# Patient Record
Sex: Male | Born: 1960 | Race: White | Hispanic: No | Marital: Married | Smoking: Never smoker
Health system: Southern US, Community
[De-identification: ages and names within clinical notes are randomized; demographics above are authoritative.]

## PROBLEM LIST (undated history)

## (undated) DIAGNOSIS — G4730 Sleep apnea, unspecified: Secondary | ICD-10-CM

## (undated) DIAGNOSIS — S46009A Unspecified injury of muscle(s) and tendon(s) of the rotator cuff of unspecified shoulder, initial encounter: Secondary | ICD-10-CM

---

## 2011-08-26 HISTORY — PX: COLONOSCOPY: SHX174

## 2012-07-30 ENCOUNTER — Ambulatory Visit: Payer: Self-pay | Admitting: Gastroenterology

## 2014-12-14 ENCOUNTER — Ambulatory Visit
Admit: 2014-12-14 | Disposition: A | Discharge status: Home / Self Care | Payer: Self-pay | Attending: Family Medicine | Admitting: Family Medicine

## 2015-11-12 IMAGING — CR DG HAND COMPLETE 3+V*L*
3 series · 3 of 3 positions shown · non-contrast
Comparison: None.

CLINICAL DATA: Crush injury of the hand while at work with
cutaneous wounds between the fourth and fifth fingers

EXAM:
LEFT HAND - COMPLETE 3+ VIEW

[hand ap]
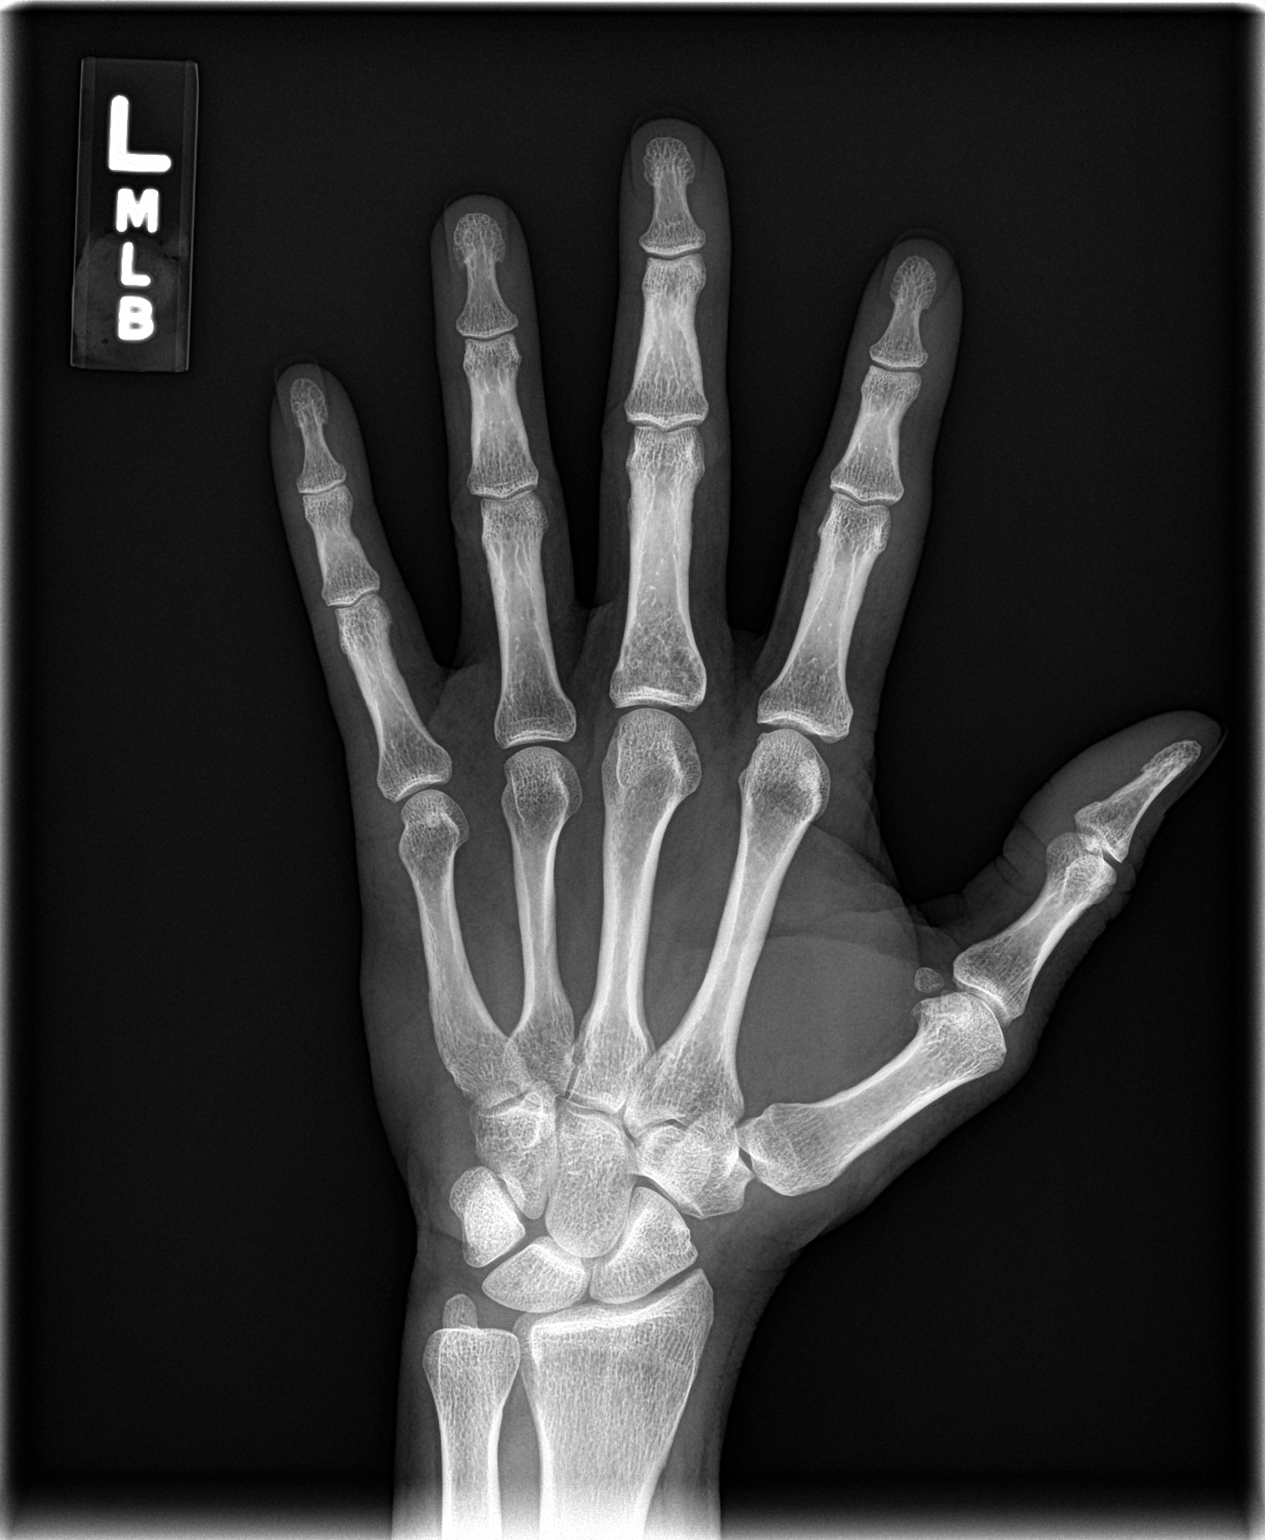

[hand obl]
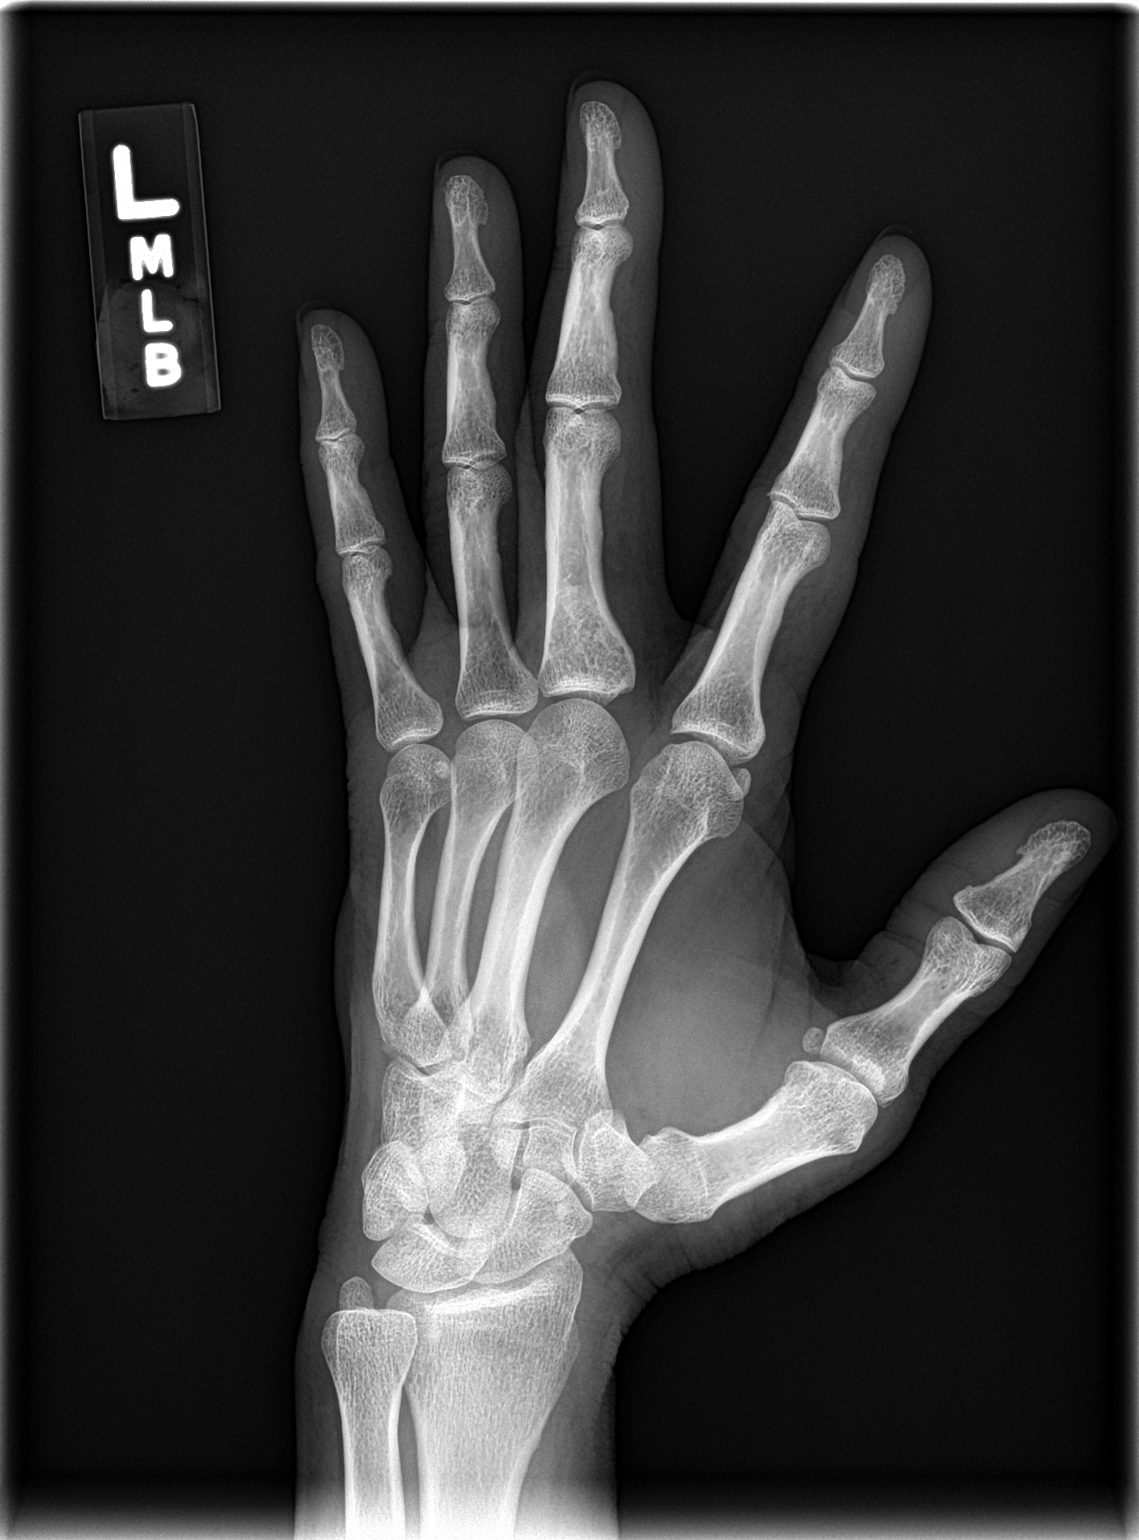

[hand lat]
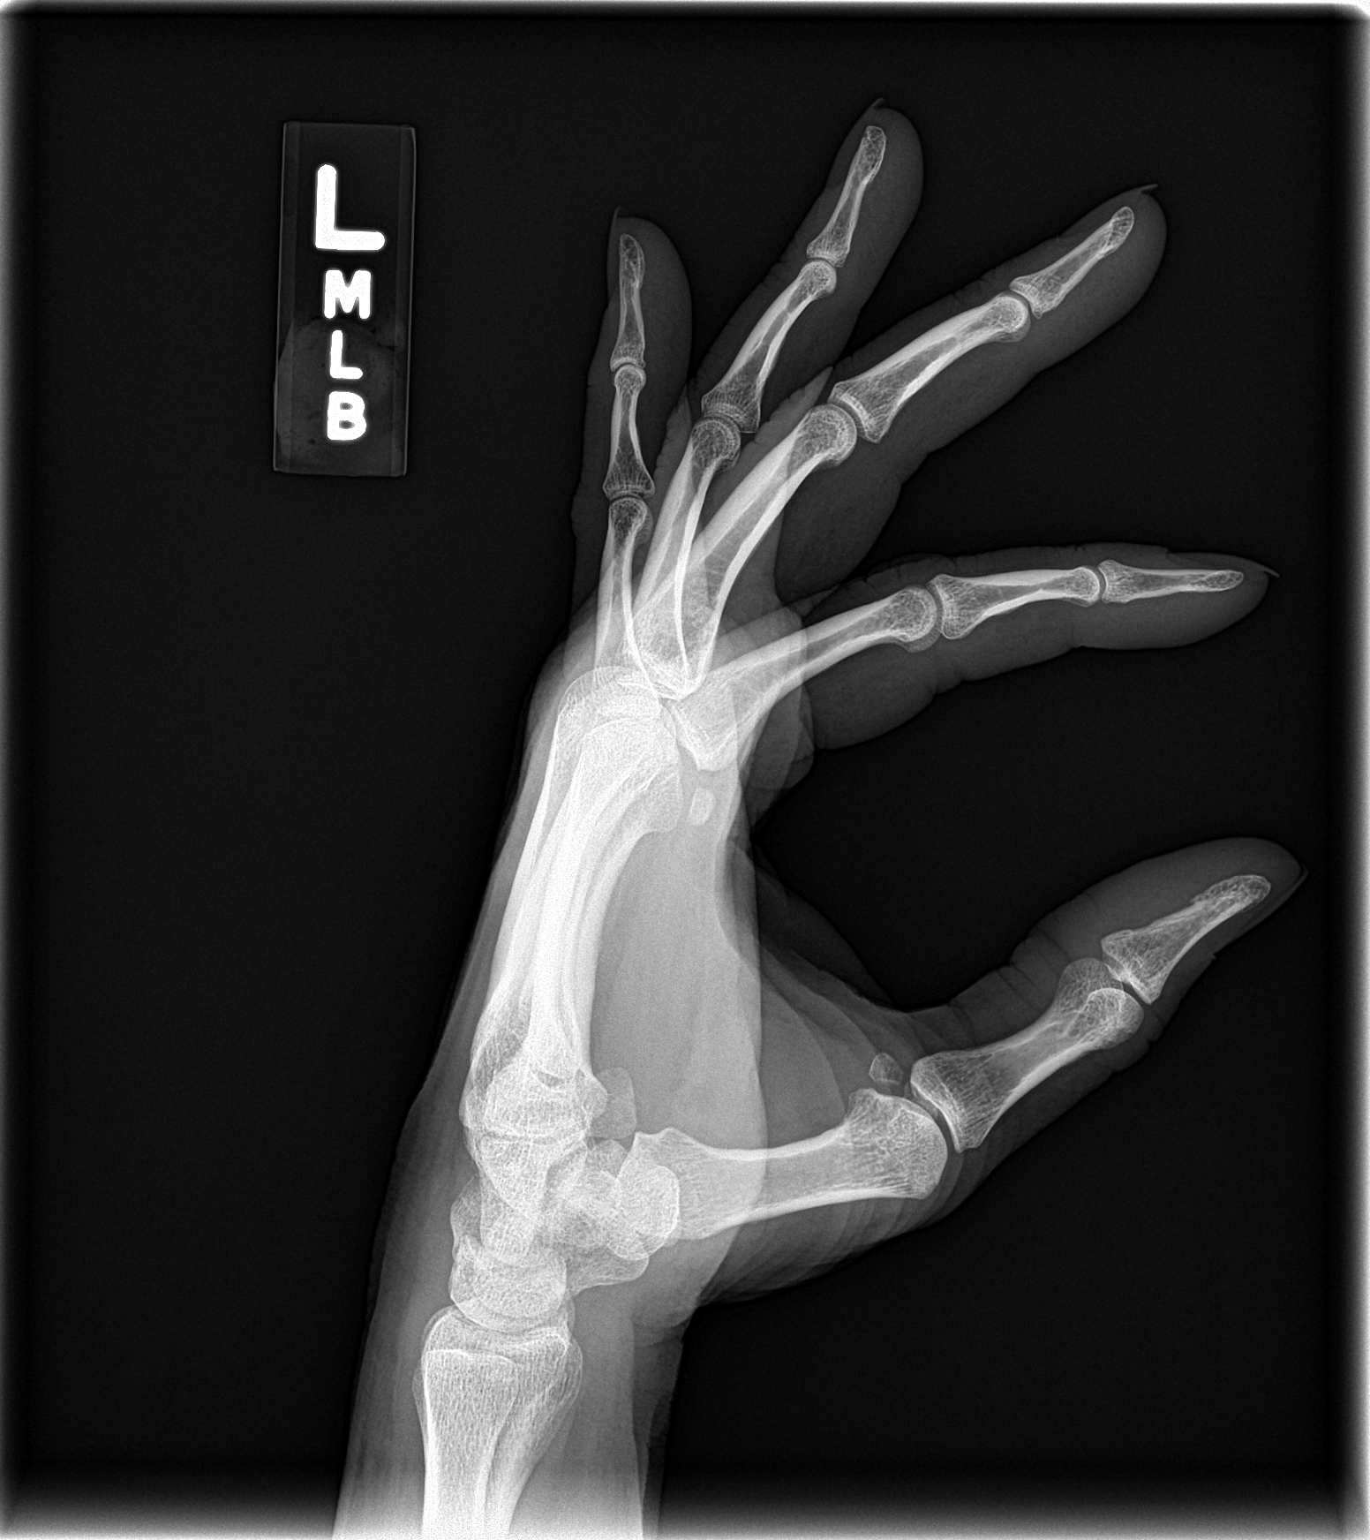

[3 of 3 positions shown; findings below may reference images not displayed]

FINDINGS: The bones of the left hand are adequately mineralized. The joint
spaces are preserved. There is no acute fracture nor significant
degenerative change. The soft tissues exhibit no foreign bodies.
IMPRESSION: There is no acute bony abnormality of the left hand.

## 2019-05-25 ENCOUNTER — Other Ambulatory Visit: Payer: Self-pay

## 2019-05-25 ENCOUNTER — Telehealth: Payer: Self-pay

## 2019-05-25 DIAGNOSIS — Z1211 Encounter for screening for malignant neoplasm of colon: Secondary | ICD-10-CM

## 2019-05-25 DIAGNOSIS — Z8371 Family history of colonic polyps: Secondary | ICD-10-CM

## 2019-05-25 NOTE — Telephone Encounter (Signed)
Gastroenterology Pre-Procedure Review  Request Date: 07/05/19 Tues Requesting Physician: Dr. Allen Norris  PATIENT REVIEW QUESTIONS: The patient responded to the following health history questions as indicated:    1. Are you having any GI issues? no 2. Do you have a personal history of Polyps? no 3. Do you have a family history of Colon Cancer or Polyps? yes (Mother and Father Colon Polyps) 4. Diabetes Mellitus? yes (oral meds metformin) 5. Joint replacements in the past 12 months?no 6. Major health problems in the past 3 months?no 7. Any artificial heart valves, MVP, or defibrillator?no    MEDICATIONS & ALLERGIES:    Patient reports the following regarding taking any anticoagulation/antiplatelet therapy:   Plavix, Coumadin, Eliquis, Xarelto, Lovenox, Pradaxa, Brilinta, or Effient? no Aspirin? yes (81 mg daily)  Patient confirms/reports the following medications:  No current outpatient medications on file.   No current facility-administered medications for this visit.     Patient confirms/reports the following allergies:  Not on File  No orders of the defined types were placed in this encounter.   AUTHORIZATION INFORMATION Primary Insurance: 1D#: Group #:  Secondary Insurance: 1D#: Group #:  SCHEDULE INFORMATION: Date: 07/05/19 Time: Location:[facility]

## 2019-07-01 ENCOUNTER — Other Ambulatory Visit
Admission: RE | Admit: 2019-07-01 | Discharge: 2019-07-01 | Disposition: A | Discharge status: Home / Self Care | Payer: 59 | Source: Ambulatory Visit | Attending: Gastroenterology | Admitting: Gastroenterology

## 2019-07-01 ENCOUNTER — Other Ambulatory Visit: Payer: Self-pay

## 2019-07-01 DIAGNOSIS — Z01812 Encounter for preprocedural laboratory examination: Secondary | ICD-10-CM | POA: Diagnosis present

## 2019-07-01 DIAGNOSIS — Z20828 Contact with and (suspected) exposure to other viral communicable diseases: Secondary | ICD-10-CM | POA: Insufficient documentation

## 2019-07-02 LAB — SARS CORONAVIRUS 2 (TAT 6-24 HRS): SARS Coronavirus 2: NEGATIVE

## 2019-07-04 ENCOUNTER — Telehealth: Payer: Self-pay | Admitting: Gastroenterology

## 2019-07-04 NOTE — Telephone Encounter (Signed)
Patient has been advised that he may not have almond milk, or protein drinks today as he should be following a clear liquid diet the entire day, starting bowel prep at 5pm, and follow instructions as advised on his instruction page regarding diabetic medications, and blood pressure medications.  Thanks [employer]

## 2019-07-04 NOTE — Telephone Encounter (Signed)
Pt  Left vm he has a procedure tomorrow and would like to know if he can drink protein drinks and almond milk please call pt

## 2019-07-05 ENCOUNTER — Ambulatory Visit: Payer: 59 | Admitting: Registered Nurse

## 2019-07-05 ENCOUNTER — Ambulatory Visit
Admission: RE | Admit: 2019-07-05 | Discharge: 2019-07-05 | Disposition: A | Discharge status: Home / Self Care | Payer: 59 | Attending: Gastroenterology | Admitting: Gastroenterology

## 2019-07-05 ENCOUNTER — Encounter
Admission: RE | Disposition: A | Discharge status: Home / Self Care | Payer: Self-pay | Source: Home / Self Care | Attending: Gastroenterology

## 2019-07-05 ENCOUNTER — Other Ambulatory Visit: Payer: Self-pay

## 2019-07-05 DIAGNOSIS — Z7982 Long term (current) use of aspirin: Secondary | ICD-10-CM | POA: Insufficient documentation

## 2019-07-05 DIAGNOSIS — K621 Rectal polyp: Secondary | ICD-10-CM | POA: Diagnosis not present

## 2019-07-05 DIAGNOSIS — G4730 Sleep apnea, unspecified: Secondary | ICD-10-CM | POA: Insufficient documentation

## 2019-07-05 DIAGNOSIS — Z8371 Family history of colonic polyps: Secondary | ICD-10-CM | POA: Insufficient documentation

## 2019-07-05 DIAGNOSIS — Z1211 Encounter for screening for malignant neoplasm of colon: Secondary | ICD-10-CM

## 2019-07-05 DIAGNOSIS — Z79899 Other long term (current) drug therapy: Secondary | ICD-10-CM | POA: Diagnosis not present

## 2019-07-05 DIAGNOSIS — K640 First degree hemorrhoids: Secondary | ICD-10-CM | POA: Insufficient documentation

## 2019-07-05 DIAGNOSIS — Z7984 Long term (current) use of oral hypoglycemic drugs: Secondary | ICD-10-CM | POA: Insufficient documentation

## 2019-07-05 HISTORY — DX: Unspecified injury of muscle(s) and tendon(s) of the rotator cuff of unspecified shoulder, initial encounter: S46.009A

## 2019-07-05 HISTORY — DX: Sleep apnea, unspecified: G47.30

## 2019-07-05 HISTORY — PX: COLONOSCOPY WITH PROPOFOL: SHX5780

## 2019-07-05 LAB — GLUCOSE, CAPILLARY: Glucose-Capillary: 169 mg/dL — ABNORMAL HIGH (ref 70–99)

## 2019-07-05 SURGERY — COLONOSCOPY WITH PROPOFOL
Anesthesia: General

## 2019-07-05 MED ORDER — PROPOFOL 500 MG/50ML IV EMUL
INTRAVENOUS | Status: DC | PRN
  Administered 2019-07-05: 140 ug/kg/min via INTRAVENOUS

## 2019-07-05 MED ORDER — PROPOFOL 10 MG/ML IV BOLUS
INTRAVENOUS | Status: DC | PRN
  Administered 2019-07-05: 30 mg via INTRAVENOUS
  Administered 2019-07-05: 70 mg via INTRAVENOUS

## 2019-07-05 MED ORDER — LIDOCAINE HCL (PF) 2 % IJ SOLN
INTRAMUSCULAR | Status: AC
  Filled 2019-07-05: qty 10

## 2019-07-05 MED ORDER — PROPOFOL 500 MG/50ML IV EMUL
INTRAVENOUS | Status: AC
  Filled 2019-07-05: qty 250

## 2019-07-05 MED ORDER — SODIUM CHLORIDE 0.9 % IV SOLN
INTRAVENOUS | Status: DC
  Administered 2019-07-05: 08:00:00 via INTRAVENOUS

## 2019-07-05 MED ORDER — GLYCOPYRROLATE 0.2 MG/ML IJ SOLN
INTRAMUSCULAR | Status: AC
  Filled 2019-07-05: qty 1

## 2019-07-05 NOTE — Anesthesia Post-op Follow-up Note (Signed)
Anesthesia QCDR form completed.        

## 2019-07-05 NOTE — Anesthesia Postprocedure Evaluation (Signed)
Anesthesia Post Note  Patient: James Everett  Procedure(s) Performed: COLONOSCOPY WITH PROPOFOL (N/A )  Patient location during evaluation: PACU Anesthesia Type: General Level of consciousness: awake and alert Pain management: pain level controlled Vital Signs Assessment: post-procedure vital signs reviewed and stable Respiratory status: spontaneous breathing, nonlabored ventilation and respiratory function stable Cardiovascular status: blood pressure returned to baseline and stable Postop Assessment: no apparent nausea or vomiting Anesthetic complications: no     Last Vitals:  Vitals:   07/05/19 0852 07/05/19 0902  BP: 96/72 125/67  Pulse:    Resp:    Temp:    SpO2:      Last Pain:  Vitals:   07/05/19 0902  TempSrc:   PainSc: 0-No pain                 Durenda Hurt

## 2019-07-05 NOTE — Anesthesia Preprocedure Evaluation (Addendum)
Anesthesia Evaluation  Patient identified by MRN, date of birth, ID band Patient awake    Reviewed: Allergy & Precautions, H&P , NPO status , Patient's Chart, lab work & pertinent test results  Airway Mallampati: I  TM Distance: >3 FB Neck ROM: full    Dental  (+) Chipped   Pulmonary sleep apnea and Continuous Positive Airway Pressure Ventilation ,           Cardiovascular negative cardio ROS       Neuro/Psych negative neurological ROS  negative psych ROS   GI/Hepatic negative GI ROS, Neg liver ROS,   Endo/Other  negative endocrine ROS  Renal/GU negative Renal ROS  negative genitourinary   Musculoskeletal   Abdominal   Peds  Hematology negative hematology ROS (+)   Anesthesia Other Findings Past Medical History: No date: Rotator cuff injury     Comment:  Right side No date: Sleep apnea  Past Surgical History: 2013: COLONOSCOPY     Reproductive/Obstetrics negative OB ROS                            Anesthesia Physical Anesthesia Plan  ASA: II  Anesthesia Plan: General   Post-op Pain Management:    Induction:   PONV Risk Score and Plan: Propofol infusion and TIVA  Airway Management Planned: Natural Airway and Nasal Cannula  Additional Equipment:   Intra-op Plan:   Post-operative Plan:   Informed Consent: I have reviewed the patients History and Physical, chart, labs and discussed the procedure including the risks, benefits and alternatives for the proposed anesthesia with the patient or authorized representative who has indicated his/her understanding and acceptance.     Dental Advisory Given  Plan Discussed with: Anesthesiologist, CRNA and Surgeon  Anesthesia Plan Comments:         Anesthesia Quick Evaluation

## 2019-07-05 NOTE — Op Note (Signed)
[facility] Gastroenterology Patient Name: James Everett Procedure Date: 07/05/2019 8:06 AM MRN: [id number] Account #: [id number] Date of Birth: 1960/10/24 Admit Type: Outpatient Age: 58 Room: [facility] ENDO ROOM 4 Gender: Male Note Status: Finalized Procedure:             Colonoscopy Indications:           Family history of advanced adenomas of the colon in                         multiple first-degree relatives Providers:             Lucilla Lame MD, MD Medicines:             Propofol per Anesthesia Complications:         No immediate complications. Procedure:             Pre-Anesthesia Assessment:                        - Prior to the procedure, a History and Physical was                         performed, and patient medications and allergies were                         reviewed. The patient's tolerance of previous                         anesthesia was also reviewed. The risks and benefits                         of the procedure and the sedation options and risks                         were discussed with the patient. All questions were                         answered, and informed consent was obtained. Prior                         Anticoagulants: The patient has taken no previous                         anticoagulant or antiplatelet agents. ASA Grade                         Assessment: II - A patient with mild systemic disease.                         After reviewing the risks and benefits, the patient                         was deemed in satisfactory condition to undergo the                         procedure.                        After obtaining informed consent, the colonoscope was  passed under direct vision. Throughout the procedure,                         the patient's blood pressure, pulse, and oxygen                         saturations were monitored continuously. The                         Colonoscope was introduced through  the anus and                         advanced to the the cecum, identified by appendiceal                         orifice and ileocecal valve. The colonoscopy was                         performed without difficulty. The patient tolerated                         the procedure well. The quality of the bowel                         preparation was excellent. Findings:      The perianal and digital rectal examinations were normal.      A 3 mm polyp was found in the rectum. The polyp was sessile. The polyp       was removed with a cold biopsy forceps. Resection and retrieval were       complete.      Non-bleeding internal hemorrhoids were found during retroflexion. The       hemorrhoids were Grade I (internal hemorrhoids that do not prolapse). Impression:            - One 3 mm polyp in the rectum, removed with a cold                         biopsy forceps. Resected and retrieved.                        - Non-bleeding internal hemorrhoids. Recommendation:        - Discharge patient to home.                        - Resume previous diet.                        - Continue present medications.                        - Await pathology results.                        - Repeat colonoscopy in 5 years for surveillance. Procedure Code(s):     --- Professional ---                        [phone], Colonoscopy, flexible; with biopsy, single or  multiple Diagnosis Code(s):     --- Professional ---                        Z83.71, Family history of colonic polyps                        K62.1, Rectal polyp CPT copyright 2019 American Medical Association. All rights reserved. The codes documented in this report are preliminary and upon coder review may  be revised to meet current compliance requirements. Midge Minium MD, MD 07/05/2019 8:32:28 AM This report has been signed electronically. Number of Addenda: 0 Note Initiated On: 07/05/2019 8:06 AM Scope Withdrawal Time: 0 hours 8  minutes 17 seconds  Total Procedure Duration: 0 hours 14 minutes 57 seconds  Estimated Blood Loss:  Estimated blood loss: none.      [facility]

## 2019-07-05 NOTE — H&P (Signed)
Midge Minium, MD [facility] [address], [address] [place], [place] [address] [phone] Fax : [phone]  Primary Care Physician:  Dortha Kern, MD Primary Gastroenterologist:  Dr. Servando Snare  Pre-Procedure History & Physical: HPI:  James Everett is a 58 y.o. male is here for an colonoscopy.   Past Medical History:  Diagnosis Date  . Rotator cuff injury    Right side  . Sleep apnea     History reviewed. No pertinent surgical history.  Prior to Admission medications   Medication Sig Start Date End Date Taking? Authorizing Provider  aspirin EC 81 MG tablet Take 81 mg by mouth daily.   Yes [provider]  co-enzyme Q-10 30 MG capsule Take 30 mg by mouth 3 (three) times daily.   Yes [provider]  lisinopril-hydrochlorothiazide (ZESTORETIC) 10-12.5 MG tablet Take 1 tablet by mouth daily.   Yes [provider]  metFORMIN (GLUCOPHAGE-XR) 750 MG 24 hr tablet Take 750 mg by mouth daily with breakfast.   Yes [provider]    Allergies as of 05/25/2019  . (Not on File)    History reviewed. No pertinent family history.  Social History   Socioeconomic History  . Marital status: Significant Other    Spouse name: Not on file  . Number of children: Not on file  . Years of education: Not on file  . Highest education level: Not on file  Occupational History  . Not on file  Social Needs  . Financial resource strain: Not on file  . Food insecurity    Worry: Not on file    Inability: Not on file  . Transportation needs    Medical: Not on file    Non-medical: Not on file  Tobacco Use  . Smoking status: Not on file  Substance and Sexual Activity  . Alcohol use: Not on file  . Drug use: Not on file  . Sexual activity: Not on file  Lifestyle  . Physical activity    Days per week: Not on file    Minutes per session: Not on file  . Stress: Not on file  Relationships  . Social Musician on phone: Not on file    Gets  together: Not on file    Attends religious service: Not on file    Active member of club or organization: Not on file    Attends meetings of clubs or organizations: Not on file    Relationship status: Not on file  . Intimate partner violence    Fear of current or ex partner: Not on file    Emotionally abused: Not on file    Physically abused: Not on file    Forced sexual activity: Not on file  Other Topics Concern  . Not on file  Social History Narrative  . Not on file    Review of Systems: See HPI, otherwise negative ROS  Physical Exam: There were no vitals taken for this visit. General:   Alert,  pleasant and cooperative in NAD Head:  Normocephalic and atraumatic. Neck:  Supple; no masses or thyromegaly. Lungs:  Clear throughout to auscultation.    Heart:  Regular rate and rhythm. Abdomen:  Soft, nontender and nondistended. Normal bowel sounds, without guarding, and without rebound.   Neurologic:  Alert and  oriented x4;  grossly normal neurologically.  Impression/Plan: James Everett is here for an colonoscopy to be performed for family history of colon polyps. Last colonoscopy 2013  Risks,  benefits, limitations, and alternatives regarding  colonoscopy have been reviewed with the patient.  Questions have been answered.  All parties agreeable.   Lucilla Lame, MD  07/05/2019, 7:56 AM

## 2019-07-05 NOTE — Transfer of Care (Signed)
Immediate Anesthesia Transfer of Care Note  Patient: James Everett  Procedure(s) Performed: COLONOSCOPY WITH PROPOFOL (N/A )  Patient Location: PACU  Anesthesia Type:General  Level of Consciousness: sedated  Airway & Oxygen Therapy: Patient Spontanous Breathing  Post-op Assessment: Report given to RN and Post -op Vital signs reviewed and stable  Post vital signs: Reviewed and stable  Last Vitals:  Vitals Value Taken Time  BP 100/53 07/05/19 0833  Temp 36.7 C 07/05/19 0832  Pulse 68 07/05/19 0834  Resp 32 07/05/19 0834  SpO2 97 % 07/05/19 0834  Vitals shown include unvalidated device data.  Last Pain:  Vitals:   07/05/19 0832  TempSrc: Temporal  PainSc: Asleep         Complications: No apparent anesthesia complications

## 2019-07-06 ENCOUNTER — Encounter: Payer: Self-pay | Admitting: Gastroenterology

## 2019-07-06 LAB — SURGICAL PATHOLOGY

## 2019-07-28 ENCOUNTER — Telehealth: Payer: Self-pay | Admitting: Gastroenterology

## 2019-07-28 NOTE — Telephone Encounter (Signed)
Spoke with pt regarding the message I received about viewing his [facility] web site and it showing most of it had been denied but he has not received a bill from [employer]. Advised pt to contact his insurance company and discuss breakdown. Advised him he can either wait to see if he receives a bill or contact the billing department after he speaks with them to discuss.

## 2019-07-28 NOTE — Telephone Encounter (Signed)
Pt left vm stating he had a procedure with Dr. Allen Norris 07/11/19 and he was checking on his [employer] website and it looks like most of it has been denied he wanted to discuss the coding

## 2022-01-25 ENCOUNTER — Ambulatory Visit
Admission: EM | Admit: 2022-01-25 | Discharge: 2022-01-25 | Disposition: A | Discharge status: Home / Self Care | Payer: 59

## 2022-01-25 ENCOUNTER — Encounter: Payer: Self-pay | Admitting: Emergency Medicine

## 2022-01-25 DIAGNOSIS — J069 Acute upper respiratory infection, unspecified: Secondary | ICD-10-CM

## 2022-01-25 MED ORDER — BENZONATATE 100 MG PO CAPS: 200.0000 mg | ORAL_CAPSULE | Freq: Three times a day (TID) | ORAL | 0 refills | Status: AC

## 2022-01-25 MED ORDER — PROMETHAZINE-DM 6.25-15 MG/5ML PO SYRP: 5.0000 mL | ORAL_SOLUTION | Freq: Four times a day (QID) | ORAL | 0 refills | Status: AC | PRN

## 2022-01-25 MED ORDER — AMOXICILLIN-POT CLAVULANATE 875-125 MG PO TABS: 1.0000 | ORAL_TABLET | Freq: Two times a day (BID) | ORAL | 0 refills | Status: AC

## 2022-01-25 MED ORDER — IPRATROPIUM BROMIDE 0.06 % NA SOLN: 2.0000 | Freq: Four times a day (QID) | NASAL | 12 refills | Status: AC

## 2022-01-25 NOTE — ED Triage Notes (Signed)
Patient c/o cough, nasal congestion, sinus congestion, and headache that started on Wed.  Patient denies fevers.

## 2022-01-25 NOTE — Discharge Instructions (Signed)
The Augmentin twice daily with food for 10 days for treatment of your URI.  Perform sinus irrigation 2-3 times a day with a [employer]Med sinus rinse kit and distilled water.  Do not use tap water.  You can use plain over-the-counter Mucinex every 6 hours to break up the stickiness of the mucus so your body can clear it.  Increase your oral fluid intake to thin out your mucus so that is also able for your body to clear more easily.  Take an over-the-counter probiotic, such as Culturelle-align-activia, 1 hour after each dose of antibiotic to prevent diarrhea.  Use the Atrovent nasal spray, 2 squirts in each nostril every 6 hours, as needed for runny nose and postnasal drip.  Use the Tessalon Perles every 8 hours during the day.  Take them with a small sip of water.  They may give you some numbness to the base of your tongue or a metallic taste in your mouth, this is normal.  Use the Promethazine DM cough syrup at bedtime for cough and congestion.  It will make you drowsy so do not take it during the day.  If you develop any new or worsening symptoms return for reevaluation or see your primary care provider.  

## 2022-01-25 NOTE — ED Provider Notes (Signed)
[facility]    CSN: [id number] Arrival date & time: 01/25/22  1458      History   Chief Complaint Chief Complaint  Patient presents with   Sinus Problem   Headache    HPI James Everett is a 61 y.o. male.   HPI  61 year old male here for evaluation of respiratory symptoms.  Patient reports that for last 3 days he has been experiencing a subjective fever, headache, nasal congestion with a brownish-yellow nasal discharge, right ear pain, sore throat, and a nonproductive cough.  He denies any shortness of breath or wheezing.  He is unaware of any sick contacts.  Past Medical History:  Diagnosis Date   Rotator cuff injury    Right side   Sleep apnea     Patient Active Problem List   Diagnosis Date Noted   Family history of colonic polyps    Rectal polyp     Past Surgical History:  Procedure Laterality Date   COLONOSCOPY  2013   COLONOSCOPY WITH PROPOFOL N/A 07/05/2019   Procedure: COLONOSCOPY WITH PROPOFOL;  Surgeon: Lucilla Lame, MD;  Location: [facility] ENDOSCOPY;  Service: Endoscopy;  Laterality: N/A;       Home Medications    Prior to Admission medications   Medication Sig Start Date End Date Taking? Authorizing Provider  amoxicillin-clavulanate (AUGMENTIN) 875-125 MG tablet Take 1 tablet by mouth every 12 (twelve) hours for 10 days. 01/25/22 02/04/22 Yes Margarette Canada, NP  aspirin EC 81 MG tablet Take 81 mg by mouth daily.   Yes [provider]  benzonatate (TESSALON) 100 MG capsule Take 2 capsules (200 mg total) by mouth every 8 (eight) hours. 01/25/22  Yes Margarette Canada, NP  co-enzyme Q-10 30 MG capsule Take 30 mg by mouth 3 (three) times daily.   Yes [provider]  ipratropium (ATROVENT) 0.06 % nasal spray Place 2 sprays into both nostrils 4 (four) times daily. 01/25/22  Yes Margarette Canada, NP  lisinopril-hydrochlorothiazide (ZESTORETIC) 10-12.5 MG tablet Take 1 tablet by mouth daily.   Yes [provider]  metFORMIN  (GLUCOPHAGE-XR) 750 MG 24 hr tablet Take 750 mg by mouth daily with breakfast.   Yes [provider]  promethazine-dextromethorphan (PROMETHAZINE-DM) 6.25-15 MG/5ML syrup Take 5 mLs by mouth 4 (four) times daily as needed. 01/25/22  Yes Margarette Canada, NP  JARDIANCE 10 MG TABS tablet Take 10 mg by mouth every morning. 12/31/21   [provider]    Family History History reviewed. No pertinent family history.  Social History Social History   Tobacco Use   Smoking status: Never   Smokeless tobacco: Never  Vaping Use   Vaping Use: Never used  Substance Use Topics   Alcohol use: Yes   Drug use: Never     Allergies   Patient has no known allergies.   Review of Systems Review of Systems  Constitutional:  Positive for fever.  HENT:  Positive for congestion, ear pain, rhinorrhea and sore throat.   Respiratory:  Positive for cough. Negative for shortness of breath and wheezing.   Gastrointestinal:  Negative for diarrhea, nausea and vomiting.  Skin:  Negative for rash.  Hematological: Negative.   Psychiatric/Behavioral: Negative.      Physical Exam Triage Vital Signs ED Triage Vitals  Enc Vitals Group     BP 01/25/22 1517 132/80     Pulse Rate 01/25/22 1517 83     Resp 01/25/22 1517 15     Temp 01/25/22 1517 98.1 F (36.7  C)     Temp Source 01/25/22 1517 Oral     SpO2 01/25/22 1517 97 %     Weight 01/25/22 1515 162 lb 14.7 oz (73.9 kg)     Height 01/25/22 1515 '6\' 2"'  (1.88 m)     Head Circumference --      Peak Flow --      Pain Score 01/25/22 1515 0     Pain Loc --      Pain Edu? --      Excl. in [place]? --    No data found.  Updated Vital Signs BP 132/80 (BP Location: Left Arm)   Pulse 83   Temp 98.1 F (36.7 C) (Oral)   Resp 15   Ht '6\' 2"'  (1.88 m)   Wt 162 lb 14.7 oz (73.9 kg)   SpO2 97%   BMI 20.92 kg/m   Visual Acuity Right Eye Distance:   Left Eye Distance:   Bilateral Distance:    Right Eye Near:   Left Eye Near:    Bilateral Near:      Physical Exam Vitals and nursing note reviewed.  Constitutional:      Appearance: Normal appearance. He is not ill-appearing.  HENT:     Head: Normocephalic and atraumatic.     Right Ear: Tympanic membrane, ear canal and external ear normal. There is no impacted cerumen.     Left Ear: Tympanic membrane, ear canal and external ear normal. There is no impacted cerumen.     Nose: Congestion and rhinorrhea present.     Mouth/Throat:     Mouth: Mucous membranes are moist.     Pharynx: Oropharynx is clear. Posterior oropharyngeal erythema present. No oropharyngeal exudate.  Cardiovascular:     Rate and Rhythm: Normal rate and regular rhythm.     Pulses: Normal pulses.     Heart sounds: Normal heart sounds. No murmur heard.   No friction rub. No gallop.  Pulmonary:     Effort: Pulmonary effort is normal.     Breath sounds: Normal breath sounds. No wheezing, rhonchi or rales.  Musculoskeletal:     Cervical back: Normal range of motion and neck supple.  Lymphadenopathy:     Cervical: No cervical adenopathy.  Skin:    General: Skin is warm and dry.     Capillary Refill: Capillary refill takes less than 2 seconds.     Findings: No erythema or rash.  Neurological:     General: No focal deficit present.     Mental Status: He is alert and oriented to person, place, and time.  Psychiatric:        Mood and Affect: Mood normal.        Behavior: Behavior normal.        Thought Content: Thought content normal.        Judgment: Judgment normal.     UC Treatments / Results  Labs (all labs ordered are listed, but only abnormal results are displayed) Labs Reviewed - No data to display  EKG   Radiology No results found.  Procedures Procedures (including critical care time)  Medications Ordered in UC Medications - No data to display  Initial Impression / Assessment and Plan / UC Course  I have reviewed the triage vital signs and the nursing notes.  Pertinent labs & imaging  results that were available during my care of the patient were reviewed by me and considered in my medical decision making (see chart for details).  Patient is a  nontoxic-appearing 61 year old male here for evaluation of upper respiratory symptoms as outlined in HPI above.  On exam patient has pearly-gray tympanic membranes bilaterally with normal light reflex and clear external auditory canals.  Nasal mucosa is erythematous and edematous with yellow purulent discharge in both nares.  He does not have tenderness to percussion of either frontal or maxillary sinuses bilaterally.  Oropharyngeal exam reveals posterior oropharyngeal erythema with yellow postnasal drip.  No cervical lymphadenopathy appreciated exam.  Cardiopulmonary exam reveals S1-S2 heart sounds with regular rate and rhythm and lung sounds that are clear to auscultation all fields.  Patient exam is consistent with an upper respiratory infection.  Given patient's short duration of symptoms I am concerned that it may be bacterial in origin as patient has purulent discharge in his naris.  I will do a trial of Augmentin twice daily for 10 days for treatment of his upper respiratory infection.  I will also prescribe Atrovent nasal spray, Tessalon Perles, and Promethazine DM cough syrup to help with cough and congestion.   Final Clinical Impressions(s) / UC Diagnoses   Final diagnoses:  Upper respiratory tract infection, unspecified type     Discharge Instructions      The Augmentin twice daily with food for 10 days for treatment of your URI.  Perform sinus irrigation 2-3 times a day with a [employer] sinus rinse kit and distilled water.  Do not use tap water.  You can use plain over-the-counter Mucinex every 6 hours to break up the stickiness of the mucus so your body can clear it.  Increase your oral fluid intake to thin out your mucus so that is also able for your body to clear more easily.  Take an over-the-counter probiotic, such as  Culturelle-align-activia, 1 hour after each dose of antibiotic to prevent diarrhea.  Use the Atrovent nasal spray, 2 squirts in each nostril every 6 hours, as needed for runny nose and postnasal drip.  Use the Tessalon Perles every 8 hours during the day.  Take them with a small sip of water.  They may give you some numbness to the base of your tongue or a metallic taste in your mouth, this is normal.  Use the Promethazine DM cough syrup at bedtime for cough and congestion.  It will make you drowsy so do not take it during the day.  If you develop any new or worsening symptoms return for reevaluation or see your primary care provider.      ED Prescriptions     Medication Sig Dispense Auth. Provider   amoxicillin-clavulanate (AUGMENTIN) 875-125 MG tablet Take 1 tablet by mouth every 12 (twelve) hours for 10 days. 20 tablet Margarette Canada, NP   ipratropium (ATROVENT) 0.06 % nasal spray Place 2 sprays into both nostrils 4 (four) times daily. 15 mL Margarette Canada, NP   benzonatate (TESSALON) 100 MG capsule Take 2 capsules (200 mg total) by mouth every 8 (eight) hours. 21 capsule Margarette Canada, NP   promethazine-dextromethorphan (PROMETHAZINE-DM) 6.25-15 MG/5ML syrup Take 5 mLs by mouth 4 (four) times daily as needed. 118 mL Margarette Canada, NP      PDMP not reviewed this encounter.   Margarette Canada, NP 01/25/22 1535

## 2024-03-30 ENCOUNTER — Other Ambulatory Visit: Payer: Self-pay | Admitting: Cardiology

## 2024-03-30 DIAGNOSIS — E7800 Pure hypercholesterolemia, unspecified: Secondary | ICD-10-CM
